# Patient Record
Sex: Male | Born: 1955 | Race: White | Hispanic: No | Marital: Married | Smoking: Never smoker
Health system: Southern US, Community
[De-identification: ages and names within clinical notes are randomized; demographics above are authoritative.]

## PROBLEM LIST (undated history)

## (undated) DIAGNOSIS — I2510 Atherosclerotic heart disease of native coronary artery without angina pectoris: Secondary | ICD-10-CM

## (undated) HISTORY — PX: CORONARY ANGIOPLASTY WITH STENT PLACEMENT: SHX49

## (undated) HISTORY — PX: APPENDECTOMY: SHX54

---

## 2015-12-07 ENCOUNTER — Other Ambulatory Visit: Payer: Self-pay | Admitting: Adult Health

## 2015-12-07 ENCOUNTER — Ambulatory Visit (INDEPENDENT_AMBULATORY_CARE_PROVIDER_SITE_OTHER): Payer: Self-pay

## 2015-12-07 DIAGNOSIS — M25521 Pain in right elbow: Secondary | ICD-10-CM

## 2015-12-07 DIAGNOSIS — T1490XA Injury, unspecified, initial encounter: Secondary | ICD-10-CM

## 2017-04-24 ENCOUNTER — Ambulatory Visit (INDEPENDENT_AMBULATORY_CARE_PROVIDER_SITE_OTHER): Payer: Self-pay

## 2017-04-24 ENCOUNTER — Other Ambulatory Visit: Payer: Self-pay | Admitting: Gerontology

## 2017-04-24 ENCOUNTER — Other Ambulatory Visit: Payer: Self-pay

## 2017-04-24 ENCOUNTER — Emergency Department (HOSPITAL_BASED_OUTPATIENT_CLINIC_OR_DEPARTMENT_OTHER)
Admission: EM | Admit: 2017-04-24 | Discharge: 2017-04-24 | Disposition: A | Discharge status: Home / Self Care | Payer: Worker's Compensation | Attending: Emergency Medicine | Admitting: Emergency Medicine

## 2017-04-24 ENCOUNTER — Encounter (HOSPITAL_BASED_OUTPATIENT_CLINIC_OR_DEPARTMENT_OTHER): Payer: Self-pay | Admitting: *Deleted

## 2017-04-24 DIAGNOSIS — Y929 Unspecified place or not applicable: Secondary | ICD-10-CM | POA: Diagnosis not present

## 2017-04-24 DIAGNOSIS — R55 Syncope and collapse: Secondary | ICD-10-CM | POA: Diagnosis present

## 2017-04-24 DIAGNOSIS — Z79899 Other long term (current) drug therapy: Secondary | ICD-10-CM | POA: Insufficient documentation

## 2017-04-24 DIAGNOSIS — Z7982 Long term (current) use of aspirin: Secondary | ICD-10-CM | POA: Diagnosis not present

## 2017-04-24 DIAGNOSIS — Y939 Activity, unspecified: Secondary | ICD-10-CM | POA: Diagnosis not present

## 2017-04-24 DIAGNOSIS — S0993XD Unspecified injury of face, subsequent encounter: Secondary | ICD-10-CM

## 2017-04-24 DIAGNOSIS — W228XXA Striking against or struck by other objects, initial encounter: Secondary | ICD-10-CM | POA: Insufficient documentation

## 2017-04-24 DIAGNOSIS — S01511A Laceration without foreign body of lip, initial encounter: Secondary | ICD-10-CM | POA: Insufficient documentation

## 2017-04-24 DIAGNOSIS — R6884 Jaw pain: Secondary | ICD-10-CM

## 2017-04-24 DIAGNOSIS — Y999 Unspecified external cause status: Secondary | ICD-10-CM | POA: Insufficient documentation

## 2017-04-24 HISTORY — DX: Atherosclerotic heart disease of native coronary artery without angina pectoris: I25.10

## 2017-04-24 LAB — CBC WITH DIFFERENTIAL/PLATELET
BASOS ABS: 0 10*3/uL (ref 0.0–0.1)
BASOS PCT: 0 %
Eosinophils Absolute: 0.1 10*3/uL (ref 0.0–0.7)
Eosinophils Relative: 1 %
HEMATOCRIT: 43.5 % (ref 39.0–52.0)
HEMOGLOBIN: 14.5 g/dL (ref 13.0–17.0)
Lymphocytes Relative: 22 %
Lymphs Abs: 2 10*3/uL (ref 0.7–4.0)
MCH: 26.2 pg (ref 26.0–34.0)
MCHC: 33.3 g/dL (ref 30.0–36.0)
MCV: 78.5 fL (ref 78.0–100.0)
MONOS PCT: 13 %
Monocytes Absolute: 1.1 10*3/uL — ABNORMAL HIGH (ref 0.1–1.0)
NEUTROS ABS: 5.8 10*3/uL (ref 1.7–7.7)
NEUTROS PCT: 64 %
Platelets: 167 10*3/uL (ref 150–400)
RBC: 5.54 MIL/uL (ref 4.22–5.81)
RDW: 13.9 % (ref 11.5–15.5)
WBC: 9.1 10*3/uL (ref 4.0–10.5)

## 2017-04-24 LAB — TROPONIN I: Troponin I: <0.03 ng/mL (ref <0.03)

## 2017-04-24 MED ORDER — AMOXICILLIN 500 MG PO CAPS: 500.0000 mg | ORAL_CAPSULE | Freq: Three times a day (TID) | ORAL | 0 refills | Status: AC

## 2017-04-24 NOTE — ED Provider Notes (Signed)
[facility] EMERGENCY DEPARTMENT Provider Note   CSN: [id number] Arrival date & time: 04/24/17  1759     History   Chief Complaint No chief complaint on file.   HPI Bryan Logan is a 62 y.o. male.  The history is provided by the patient and medical records. No language interpreter was used.   Bryan Logan is a 62 y.o. male  with a PMH of CAD who presents to the Emergency Department for two complaints:  1. Mouth pain. Seen two days ago at outside ER after hitting chin with a tool. Had laceration repair of chin and inner lip. Yesterday, he noticed pus expressed from the inner lip. The inner lip laceration seems more sore today as well. He has not been taking any medications (no ABX). No fever, chills.    2. Near syncopal episode last night. He got out of the shower and felt lightheaded. There was no associated chest pain, shortness of breath, diaphoresis, nausea or vomiting. He did have visual changes described as "seeing stars". He sat down and symptoms improved. Today, he was been feeling tired, but otherwise no other complaints.    Past Medical History:  Diagnosis Date  . Coronary artery disease     There are no active problems to display for this patient.   Past Surgical History:  Procedure Laterality Date  . APPENDECTOMY    . CORONARY ANGIOPLASTY WITH STENT PLACEMENT         Home Medications    Prior to Admission medications   Medication Sig Start Date End Date Taking? Authorizing Provider  amLODipine (NORVASC) 10 MG tablet Take 10 mg by mouth daily.   Yes [provider]  aspirin EC 81 MG tablet Take 81 mg by mouth daily.   Yes [provider]  atorvastatin (LIPITOR) 80 MG tablet Take 80 mg by mouth daily.   Yes [provider]  HYDROcodone-acetaminophen (NORCO/VICODIN) 5-325 MG tablet Take 1 tablet by mouth every 6 (six) hours as needed for moderate pain.   Yes [provider]  losartan (COZAAR) 25 MG tablet Take 25 mg  by mouth daily.   Yes [provider]  nitroGLYCERIN (NITROSTAT) 0.4 MG SL tablet Place 0.4 mg under the tongue every 5 (five) minutes as needed for chest pain.   Yes [provider]  ticagrelor (BRILINTA) 90 MG TABS tablet Take by mouth 2 (two) times daily.   Yes [provider]  amoxicillin (AMOXIL) 500 MG capsule Take 1 capsule (500 mg total) by mouth 3 (three) times daily. 04/24/17   Biran Mayberry, Chase PicketJaime Pilcher, PA-C    Family History No family history on file.  Social History Social History   Tobacco Use  . Smoking status: Never Smoker  . Smokeless tobacco: Never Used  Substance Use Topics  . Alcohol use: Yes    Frequency: Never  . Drug use: No     Allergies   Patient has no known allergies.   Review of Systems Review of Systems  HENT: Positive for mouth sores (Laceration).   Cardiovascular: Negative for chest pain.  Neurological: Positive for light-headedness.  All other systems reviewed and are negative.    Physical Exam Updated Vital Signs BP (!) 142/73   Pulse (!) 59   Temp 98.6 F (37 C) (Oral)   Resp 17   Ht 5\' 11"  (1.803 m)   Wt 113.4 kg (250 lb)   SpO2 100%   BMI 34.87 kg/m   Physical Exam  Constitutional: He is  oriented to person, place, and time. He appears well-developed and well-nourished. No distress.  HENT:  Head: Normocephalic.  Laceration to chin with stitches in place - healing well with no surrounding erythema. Laceration to inner lower lip which is tender to palpation. No abscess noted. No active drainage.  Cardiovascular: Normal rate, regular rhythm and normal heart sounds.  No murmur heard. Pulmonary/Chest: Effort normal and breath sounds normal. No respiratory distress.  Abdominal: Soft. He exhibits no distension. There is no tenderness.  Musculoskeletal: He exhibits no edema.  Neurological: He is alert and oriented to person, place, and time.  Skin: Skin is warm and dry.  Nursing note and vitals  reviewed.    ED Treatments / Results  Labs (all labs ordered are listed, but only abnormal results are displayed) Labs Reviewed  CBC WITH DIFFERENTIAL/PLATELET - Abnormal; Notable for the following components:      Result Value   Monocytes Absolute 1.1 (*)    All other components within normal limits  TROPONIN I    EKG  EKG Interpretation  Date/Time:  Thursday April 24 2017 19:05:32 EST Ventricular Rate:  61 PR Interval:    QRS Duration: 103 QT Interval:  418 QTC Calculation: 421 R Axis:   51 Text Interpretation:  Sinus rhythm No prior ECG for comparison.  No STEMI Confirmed by Theda Belfast ([id number]) on 04/24/2017 7:25:35 PM       Radiology Dg Mandible 4 Views  Result Date: 04/24/2017 CLINICAL DATA:  Chin pain EXAM: MANDIBLE - 4+ VIEW COMPARISON:  None. FINDINGS: There is no evidence of fracture or other focal bone lesions. IMPRESSION: Negative. Electronically Signed   By: Elige Ko   On: 04/24/2017 13:10    Procedures Procedures (including critical care time)  Medications Ordered in ED Medications - No data to display   Initial Impression / Assessment and Plan / ED Course  I have reviewed the triage vital signs and the nursing notes.  Pertinent labs & imaging results that were available during my care of the patient were reviewed by me and considered in my medical decision making (see chart for details).    Bryan Logan is a 62 y.o. male who presents to ED for two complaints:   1. Mouth pain. Seen in outside ED two days ago after sustaining laceration to chin and inner lip. Outside healing well. He has noticed swelling and small amount of pus from the inner lip. Not on ABX. No fever, chills. No abscess on exam. Will start on amoxil and have patient follow up with his dentist. Plan discussed with patient who agrees.   2. Pre-syncopal episode last night. Patient did have two cardiac stents placed two weeks ago. Denies any chest pain and feels better today. EKG  reassuring. Normal cardiopulmonary examination. Plan for cbc and troponin which is pending at shift change. Care assumed by oncoming provider, PA Laveda Norman. If negative, will discharge home with PCP follow up.   Patient discussed with Dr. Rush Landmark who agrees with treatment plan.   Final Clinical Impressions(s) / ED Diagnoses   Final diagnoses:  Near syncope  Lip laceration, initial encounter    ED Discharge Orders        Ordered    amoxicillin (AMOXIL) 500 MG capsule  3 times daily     04/24/17 1958       Matteo Banke, Chase Picket, PA-C 04/24/17 2106    Tegeler, Canary Brim, MD 04/25/17 [phone]

## 2017-04-24 NOTE — ED Triage Notes (Signed)
Recheck facial laceration.

## 2017-04-24 NOTE — ED Notes (Signed)
Pt on monitor 

## 2017-04-24 NOTE — Discharge Instructions (Signed)
It was my pleasure taking care of you today!   Please take all of your antibiotics until finished! Rinse mouth with salt water or mouth wash after meals.   Follow up with your primary care doctor.   Return to ER for chest pain, trouble breathing, new or worsening symptoms, any additional concerns.

## 2019-07-25 IMAGING — DX DG MANDIBLE 4+V
4 series · 4 of 4 positions shown · non-contrast
Comparison: None.

CLINICAL DATA: Chin pain

EXAM:
MANDIBLE - 4+ VIEW

[mandible pa (1 of 3)]
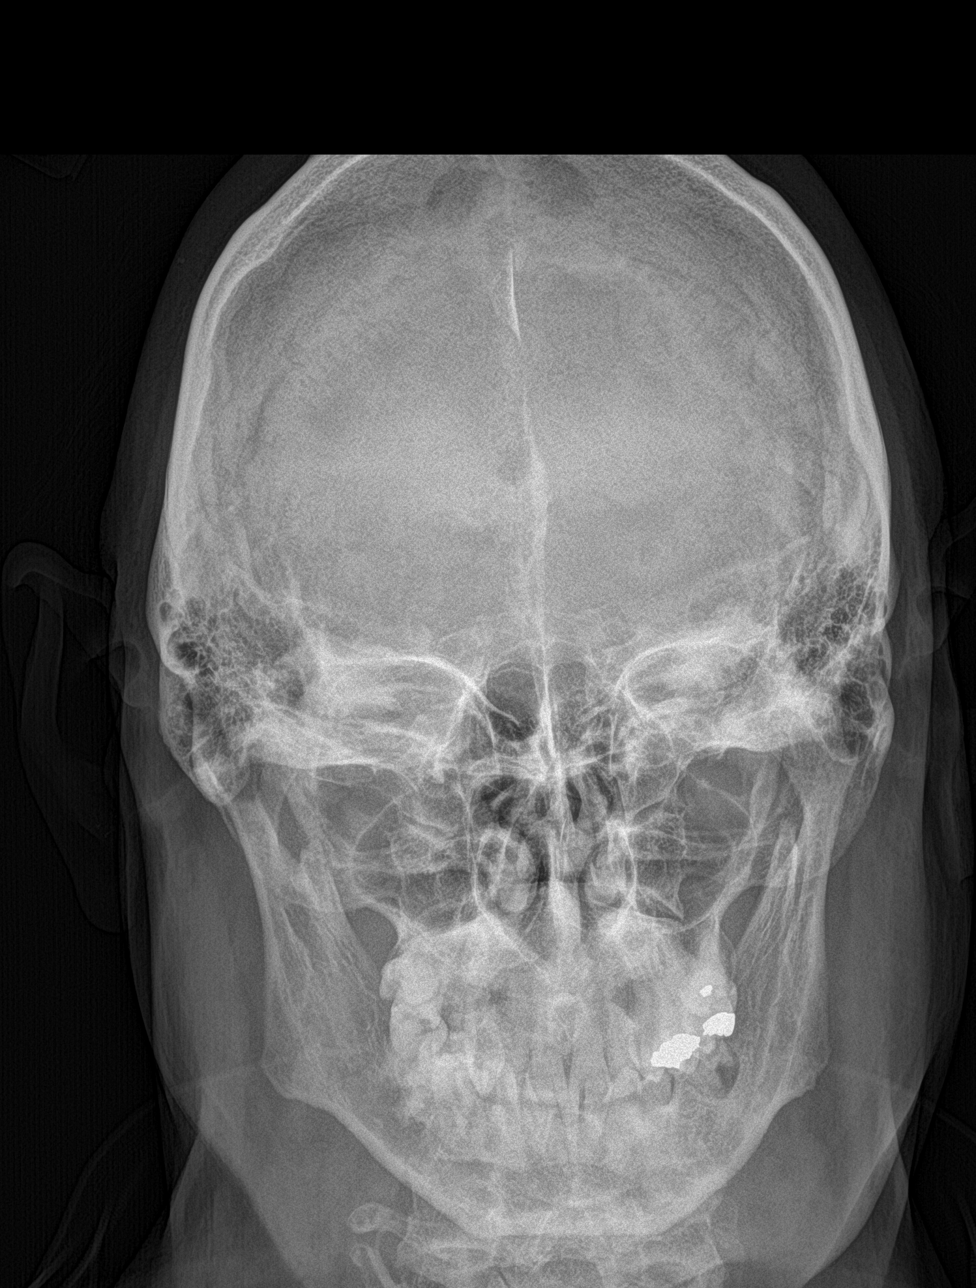

[mandible townes]
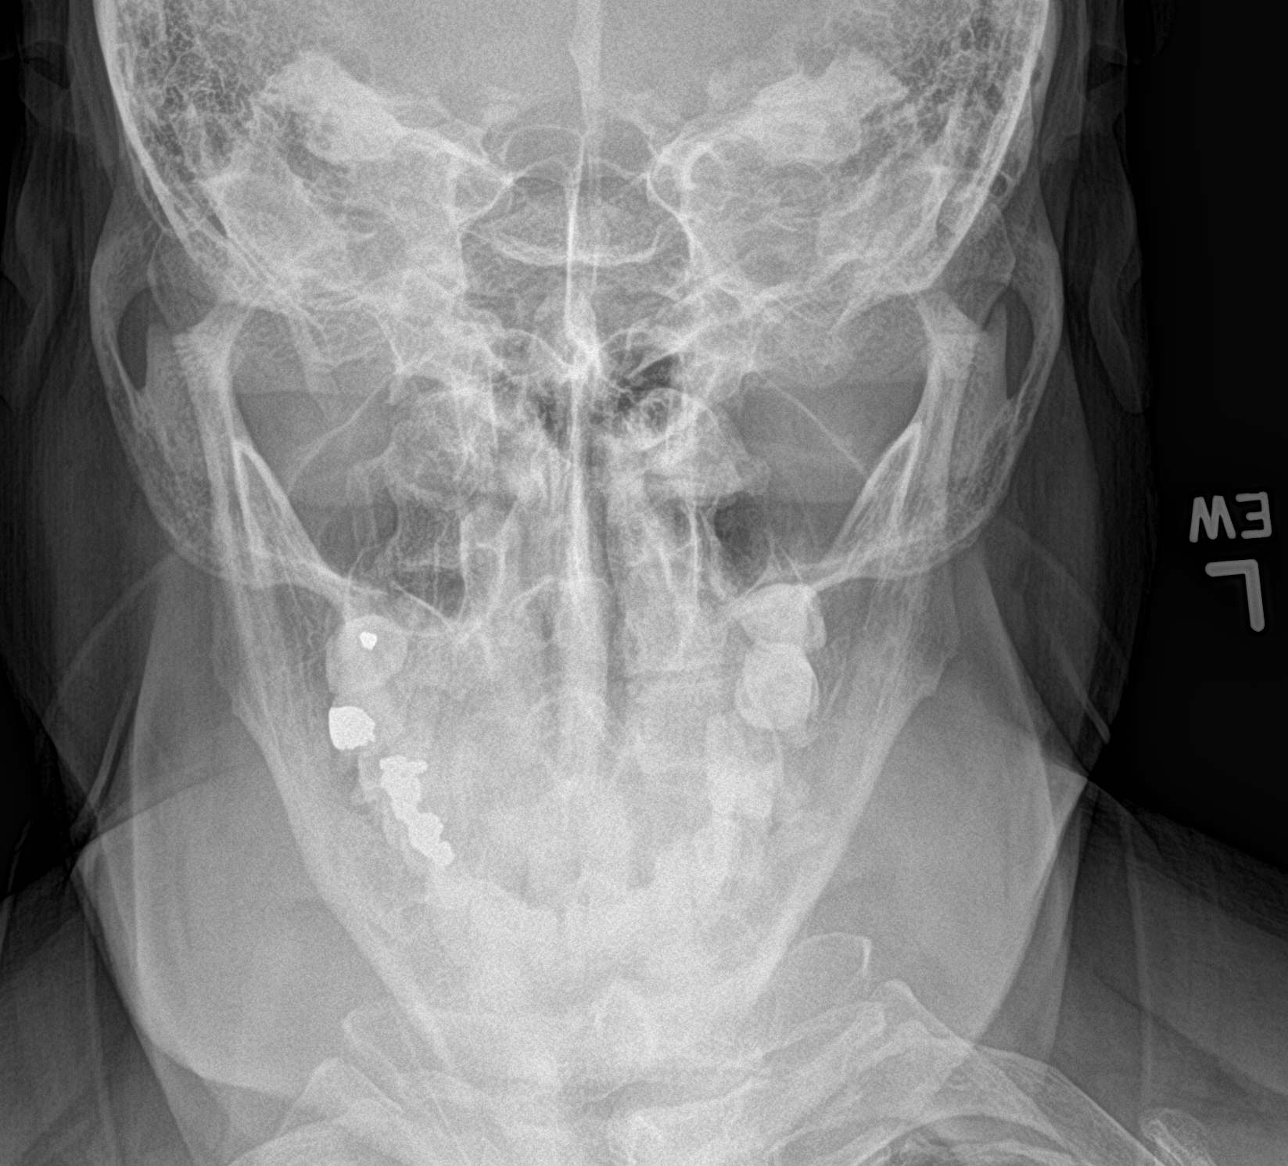

[mandible pa (2 of 3)]
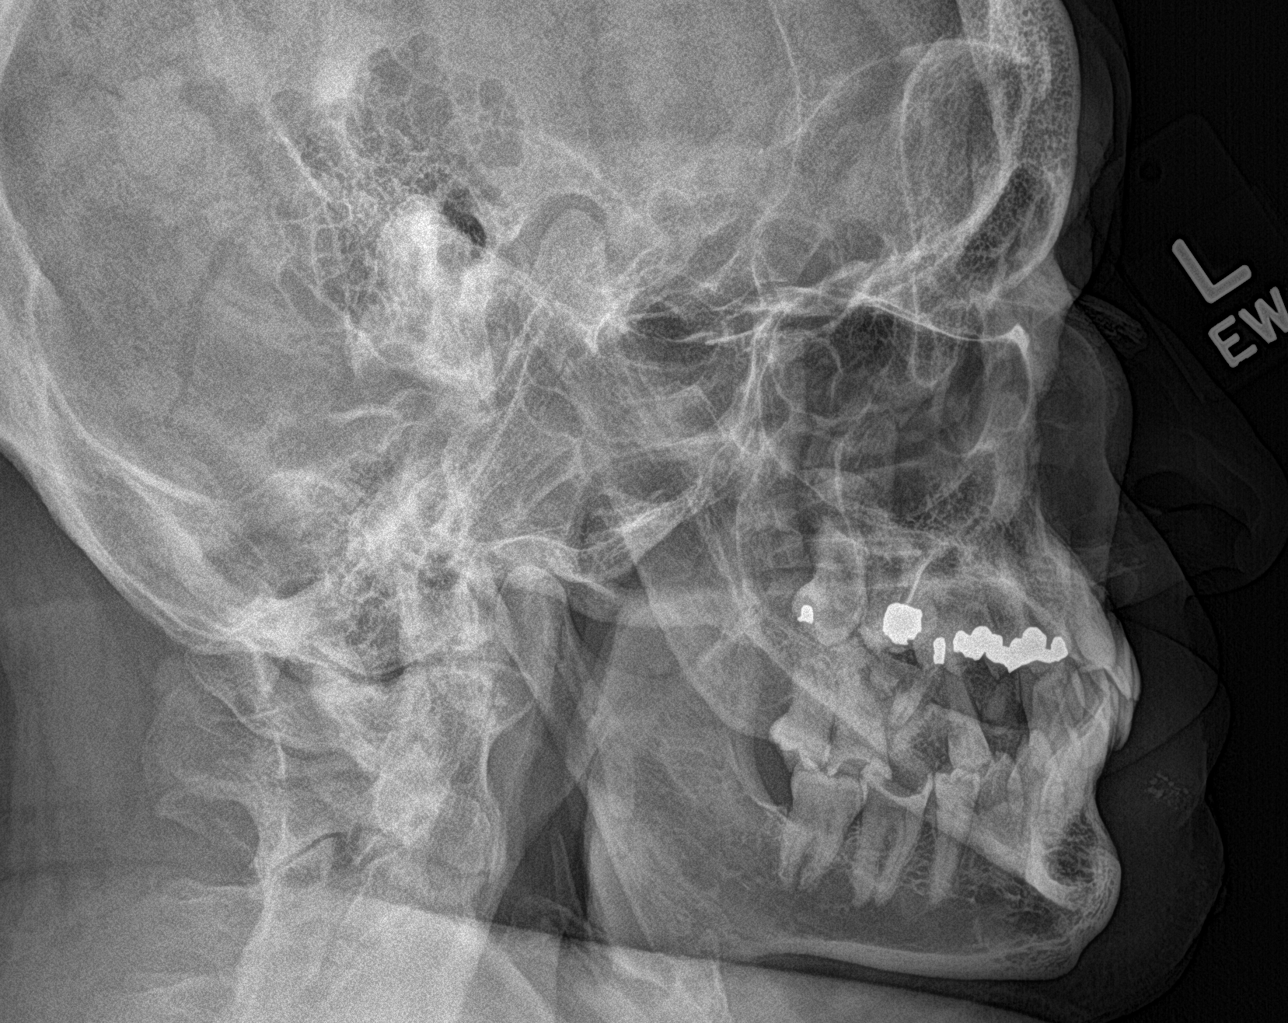

[mandible pa (3 of 3)]
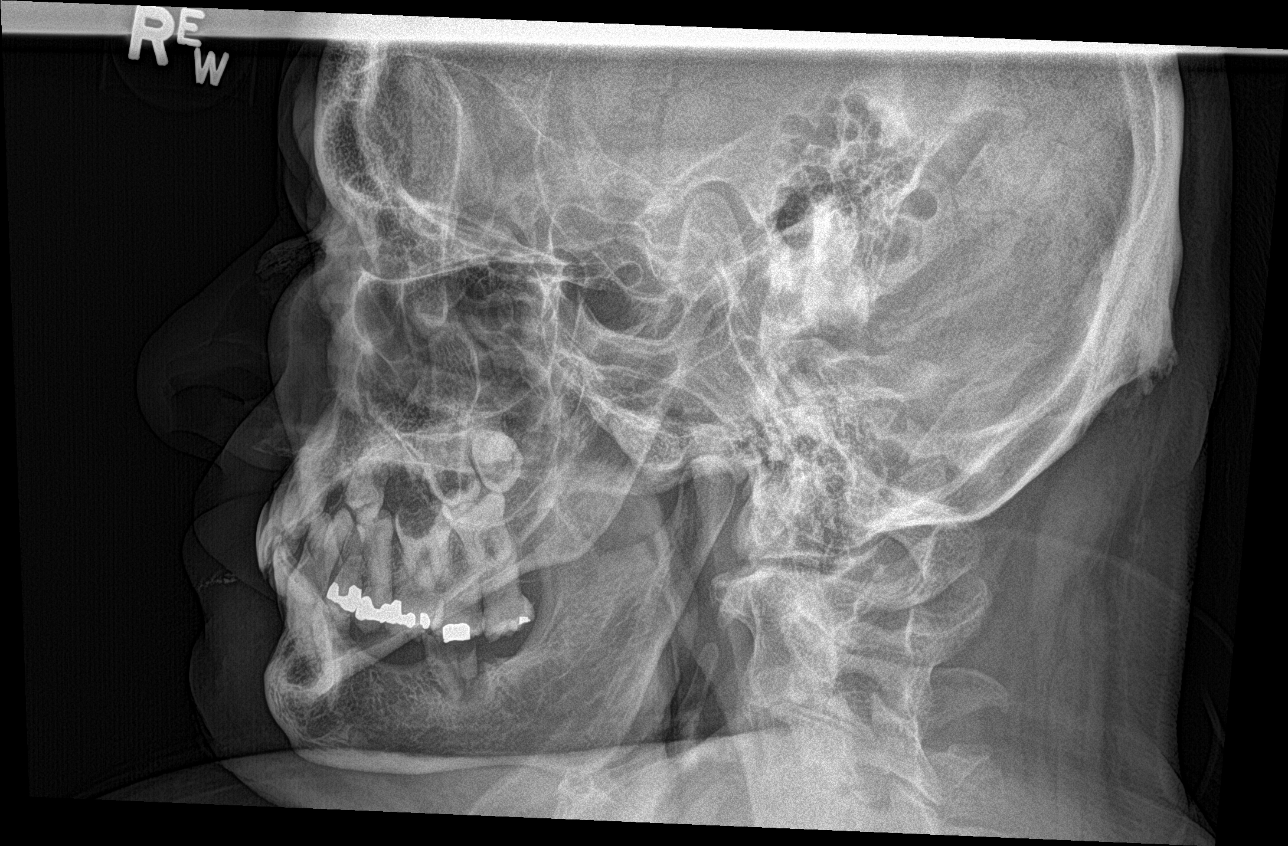

[4 of 4 positions shown; findings below may reference images not displayed]

FINDINGS: There is no evidence of fracture or other focal bone lesions.
IMPRESSION: Negative.
